# Patient Record
Sex: Female | Born: 1935 | Race: White | Smoking: Former smoker
Health system: Southern US, Community
[De-identification: ages and names within clinical notes are randomized; demographics above are authoritative.]

## PROBLEM LIST (undated history)

## (undated) DIAGNOSIS — K219 Gastro-esophageal reflux disease without esophagitis: Secondary | ICD-10-CM

## (undated) DIAGNOSIS — M549 Dorsalgia, unspecified: Secondary | ICD-10-CM

## (undated) DIAGNOSIS — E119 Type 2 diabetes mellitus without complications: Secondary | ICD-10-CM

## (undated) DIAGNOSIS — I10 Essential (primary) hypertension: Secondary | ICD-10-CM

## (undated) HISTORY — PX: OTHER SURGICAL HISTORY: SHX169

## (undated) HISTORY — PX: ABDOMINAL HYSTERECTOMY: SHX81

---

## 2015-10-04 ENCOUNTER — Encounter: Payer: Self-pay | Admitting: Emergency Medicine

## 2015-10-04 ENCOUNTER — Emergency Department: Payer: Medicare PPO

## 2015-10-04 ENCOUNTER — Emergency Department
Admission: EM | Admit: 2015-10-04 | Discharge: 2015-10-04 | Disposition: A | Discharge status: Home / Self Care | Payer: Medicare PPO | Attending: Emergency Medicine | Admitting: Emergency Medicine

## 2015-10-04 DIAGNOSIS — R1032 Left lower quadrant pain: Secondary | ICD-10-CM | POA: Diagnosis present

## 2015-10-04 DIAGNOSIS — R109 Unspecified abdominal pain: Secondary | ICD-10-CM | POA: Diagnosis not present

## 2015-10-04 DIAGNOSIS — R112 Nausea with vomiting, unspecified: Secondary | ICD-10-CM | POA: Diagnosis not present

## 2015-10-04 DIAGNOSIS — I10 Essential (primary) hypertension: Secondary | ICD-10-CM | POA: Insufficient documentation

## 2015-10-04 DIAGNOSIS — E119 Type 2 diabetes mellitus without complications: Secondary | ICD-10-CM | POA: Insufficient documentation

## 2015-10-04 HISTORY — DX: Essential (primary) hypertension: I10

## 2015-10-04 HISTORY — DX: Gastro-esophageal reflux disease without esophagitis: K21.9

## 2015-10-04 HISTORY — DX: Type 2 diabetes mellitus without complications: E11.9

## 2015-10-04 HISTORY — DX: Dorsalgia, unspecified: M54.9

## 2015-10-04 LAB — CBC WITH DIFFERENTIAL/PLATELET
BASOS PCT: 1 %
Basophils Absolute: 0.1 10*3/uL (ref 0–0.1)
Eosinophils Absolute: 0.1 10*3/uL (ref 0–0.7)
Eosinophils Relative: 1 %
HEMATOCRIT: 44.9 % (ref 35.0–47.0)
Hemoglobin: 14.3 g/dL (ref 12.0–16.0)
LYMPHS ABS: 1.4 10*3/uL (ref 1.0–3.6)
LYMPHS PCT: 10 %
MCH: 28.9 pg (ref 26.0–34.0)
MCHC: 32 g/dL (ref 32.0–36.0)
MCV: 90.3 fL (ref 80.0–100.0)
MONO ABS: 0.6 10*3/uL (ref 0.2–0.9)
MONOS PCT: 4 %
NEUTROS ABS: 12 10*3/uL — AB (ref 1.4–6.5)
Neutrophils Relative %: 84 %
Platelets: 289 10*3/uL (ref 150–440)
RBC: 4.97 MIL/uL (ref 3.80–5.20)
RDW: 13.3 % (ref 11.5–14.5)
WBC: 14.2 10*3/uL — ABNORMAL HIGH (ref 3.6–11.0)

## 2015-10-04 LAB — COMPREHENSIVE METABOLIC PANEL
ALT: 15 U/L (ref 14–54)
ANION GAP: 9 (ref 5–15)
AST: 17 U/L (ref 15–41)
Albumin: 4 g/dL (ref 3.5–5.0)
Alkaline Phosphatase: 70 U/L (ref 38–126)
BILIRUBIN TOTAL: 0.5 mg/dL (ref 0.3–1.2)
BUN: 12 mg/dL (ref 6–20)
CALCIUM: 9 mg/dL (ref 8.9–10.3)
CO2: 27 mmol/L (ref 22–32)
Chloride: 100 mmol/L — ABNORMAL LOW (ref 101–111)
Creatinine, Ser: 0.96 mg/dL (ref 0.44–1.00)
GFR, EST NON AFRICAN AMERICAN: 55 mL/min — AB (ref >60)
Glucose, Bld: 189 mg/dL — ABNORMAL HIGH (ref 65–99)
POTASSIUM: 3.4 mmol/L — AB (ref 3.5–5.1)
Sodium: 136 mmol/L (ref 135–145)
TOTAL PROTEIN: 7.7 g/dL (ref 6.5–8.1)

## 2015-10-04 LAB — URINALYSIS COMPLETE WITH MICROSCOPIC (ARMC ONLY)
Bilirubin Urine: NEGATIVE
Glucose, UA: NEGATIVE mg/dL
KETONES UR: NEGATIVE mg/dL
NITRITE: POSITIVE — AB
PH: 5 (ref 5.0–8.0)
PROTEIN: 100 mg/dL — AB
SPECIFIC GRAVITY, URINE: 1.023 (ref 1.005–1.030)

## 2015-10-04 LAB — LIPASE, BLOOD: Lipase: 27 U/L (ref 11–51)

## 2015-10-04 MED ORDER — ONDANSETRON HCL 4 MG/2ML IJ SOLN
4.0000 mg | Freq: Once | INTRAMUSCULAR | Status: AC
  Administered 2015-10-04: 4 mg via INTRAVENOUS
  Filled 2015-10-04: qty 2

## 2015-10-04 MED ORDER — CIPROFLOXACIN HCL 500 MG PO TABS: 500.0000 mg | ORAL_TABLET | Freq: Two times a day (BID) | ORAL | Status: AC

## 2015-10-04 MED ORDER — HYDROMORPHONE HCL 2 MG PO TABS: 2.0000 mg | ORAL_TABLET | Freq: Two times a day (BID) | ORAL | Status: DC | PRN

## 2015-10-04 MED ORDER — HYDROMORPHONE HCL 1 MG/ML IJ SOLN
0.5000 mg | Freq: Once | INTRAMUSCULAR | Status: AC
  Administered 2015-10-04: 0.5 mg via INTRAVENOUS
  Filled 2015-10-04: qty 1

## 2015-10-04 MED ORDER — TAMSULOSIN HCL 0.4 MG PO CAPS: 0.4000 mg | ORAL_CAPSULE | Freq: Every day | ORAL | Status: DC

## 2015-10-04 MED ORDER — HYDROMORPHONE HCL 2 MG PO TABS
2.0000 mg | ORAL_TABLET | Freq: Once | ORAL | Status: AC
  Administered 2015-10-04: 2 mg via ORAL
  Filled 2015-10-04: qty 1

## 2015-10-04 MED ORDER — KETOROLAC TROMETHAMINE 30 MG/ML IJ SOLN
30.0000 mg | Freq: Once | INTRAMUSCULAR | Status: AC
  Administered 2015-10-04: 30 mg via INTRAVENOUS
  Filled 2015-10-04: qty 1

## 2015-10-04 MED ORDER — ONDANSETRON HCL 4 MG PO TABS: 4.0000 mg | ORAL_TABLET | Freq: Three times a day (TID) | ORAL | Status: DC | PRN

## 2015-10-04 MED ORDER — SODIUM CHLORIDE 0.9 % IV BOLUS (SEPSIS)
500.0000 mL | Freq: Once | INTRAVENOUS | Status: AC
  Administered 2015-10-04: 500 mL via INTRAVENOUS

## 2015-10-04 NOTE — ED Provider Notes (Signed)
Time Seen: Approximately ----------------------------------------- 12:41 PM on 10/04/2015 -----------------------------------------   I have reviewed the triage notes  Chief Complaint: Abdominal Pain   History of Present Illness: Karen Page is a 80 y.o. female who presents with acute onset of left-sided flank and left lower quadrant abdominal pain that started suddenly today at 8 AM. Patient states that it makes her want to move around but no increased pain with movement. She denies any dysuria, hematuria, urinary frequency. She denies any right-sided abdominal pain. She has had persistent nausea and has vomited multiple times with no blood or bile. Patient denies any loose stool or diarrhea. She denies any fever at home. No radiation of pain into the leg or lower back in the middle.   Past Medical History  Diagnosis Date  . Hypertension   . Back pain   . GERD (gastroesophageal reflux disease)   . Diabetes mellitus without complication (HCC)     There are no active problems to display for this patient.   History reviewed. No pertinent past surgical history.  History reviewed. No pertinent past surgical history.  No current outpatient prescriptions on file.  Allergies:  Codeine and Tramadol  Family History: History reviewed. No pertinent family history.  Social History: Social History  Substance Use Topics  . Smoking status: Never Smoker   . Smokeless tobacco: None  . Alcohol Use: No     Review of Systems:   10 point review of systems was performed and was otherwise negative:  Constitutional: No fever Eyes: No visual disturbances ENT: No sore throat, ear pain Cardiac: No chest pain Respiratory: No shortness of breath, wheezing, or stridor Abdomen: Exclusive of left-sided left flank and left lower abdominal area Endocrine: No weight loss, No night sweats Extremities: No peripheral edema, cyanosis Skin: No rashes, easy bruising Neurologic: No focal  weakness, trouble with speech or swollowing Urologic: No dysuria, Hematuria, or urinary frequency  Physical Exam:  ED Triage Vitals  Enc Vitals Group     BP 10/04/15 1216 140/99 mmHg     Pulse Rate 10/04/15 1216 85     Resp 10/04/15 1216 18     Temp --      Temp src --      SpO2 10/04/15 1216 100 %     Weight 10/04/15 1216 176 lb (79.833 kg)     Height 10/04/15 1216 5\' 3"  (1.6 m)     Head Cir --      Peak Flow --      Pain Score 10/04/15 1216 10     Pain Loc --      Pain Edu? --      Excl. in GC? --     General: Awake , Alert , and Oriented times 3; GCS 15 patient appears uncomfortable and has difficulty sitting still on the stretcher Head: Normal cephalic , atraumatic Eyes: Pupils equal , round, reactive to light Nose/Throat: No nasal drainage, patent upper airway without erythema or exudate.  Neck: Supple, Full range of motion, No anterior adenopathy or palpable thyroid masses Lungs: Clear to ascultation without wheezes , rhonchi, or rales Heart: Regular rate, regular rhythm without murmurs , gallops , or rubs Abdomen: Soft, mild tenderness to deep palpation in the left anterior abdominal area without rebound, guarding , or rigidity; bowel sounds positive and symmetric in all 4 quadrants. No organomegaly .   No abdominal masses     Extremities: 2 plus symmetric pulses. No edema, clubbing or cyanosis Neurologic: normal ambulation, Motor  symmetric without deficits, sensory intact Skin: warm, dry, no rashes   Labs:   All laboratory work was reviewed including any pertinent negatives or positives listed below:  Labs Reviewed  CBC WITH DIFFERENTIAL/PLATELET  COMPREHENSIVE METABOLIC PANEL  LIPASE, BLOOD  URINALYSIS COMPLETEWITH MICROSCOPIC ([facility] ONLY)    EKG: ED ECG REPORT I, Jennye Moccasin, the attending physician, personally viewed and interpreted this ECG.  Date: 10/04/2015 EKG Time: 1223 Rate: 80 Rhythm: normal sinus rhythm QRS Axis: normal Intervals:  normal ST/T Wave abnormalities: normal Conduction Disutrbances: none Narrative Interpretation: unremarkable   Radiology:   EXAM: CT ABDOMEN AND PELVIS WITHOUT CONTRAST  TECHNIQUE: Multidetector CT imaging of the abdomen and pelvis was performed following the standard protocol without IV contrast.  COMPARISON: None.  FINDINGS: Lower Chest: The lung bases are clear. Visualized cardiac structures are within normal limits for size. No pericardial effusion. Unremarkable visualized distal thoracic esophagus.  Abdomen: Unenhanced CT was performed per clinician order. Lack of IV contrast limits sensitivity and specificity, especially for evaluation of abdominal/pelvic solid viscera. Within these limitations, unremarkable CT appearance of the stomach, duodenum, spleen, adrenal glands and pancreas. Normal hepatic contour and morphology. Diffuse low attenuation of the hepatic parenchyma consistent with hepatic steatosis. There is trace sparing around the gallbladder fossa. Gallbladder is unremarkable. No intra or extrahepatic biliary ductal dilatation.  Unremarkable appearance of the right kidney. No hydronephrosis or nephrolithiasis. Mild left-sided hydronephrosis secondary to a 2-3 mm stone in the distal ureter. Mild left perinephric stranding. No additional nephrolithiasis identified.  Colonic diverticular disease without CT evidence of active inflammation. The appendix is surgically absent. No focal bowel wall thickening or evidence obstruction. No free fluid or suspicious adenopathy.  Pelvis: Surgical changes of prior hysterectomy. There is a moderate pelvic floor laxity. Unremarkable bladder. No free fluid or suspicious adenopathy.  Bones/Soft Tissues: No acute fracture or aggressive appearing lytic or blastic osseous lesion.  Vascular: Limited evaluation in the absence of intravenous contrast. Mild atherosclerotic vascular calcifications without evidence  of aneurysm.  IMPRESSION: 1. Obstructing 3 mm stone in the distal left ureter resulting in mild hydronephrosis and perinephric edema. 2. No additional nephrolithiasis identified. 3. Surgical changes of prior partial hysterectomy and appendectomy. 4. Sigmoid diverticulosis without evidence of active inflammation. 5. Scattered atherosclerotic vascular calcifications.   I personally reviewed the radiologic studies     ED Course: Differential diagnosis includes but is not exclusive to ovarian cyst, ovarian torsion, acute appendicitis, urinary tract infection, endometriosis, bowel obstruction, colitis, renal colic, gastroenteritis, etc. Given the patient's current clinical presentation and objective findings appears that she has renal colic. Patient more importantly doesn't have any signs of acute abdominal aneurysmal disease or diverticulitis. Patient had good pain control with IV medications here in emergency department discharged on Cipro for what appears to be an associated urinary tract infection. I felt patient unlikely to have pyelonephritis at this time. Patient's case was reviewed with the on-call urologist and agree with outpatient management and the patient be established on Cipro as an outpatient and also has a urine culture pending. She'll be prescribed Flomax along with Dilaudid and Zofran.*    Assessment: * Acute renal colic   Final Clinical Impression:   Final diagnoses:  Left flank pain     Plan:  Outpatient management Patient was advised to return immediately if condition worsens. Patient was advised to follow up with her primary care physician or other specialized physicians involved and in their current assessment.  Jennye Moccasin, MD 10/04/15 [phone]

## 2015-10-04 NOTE — ED Notes (Signed)
Pt reports left lower back pain that wraps around to her llq started 0800 and has gotten progressively worse today with vomiting.

## 2015-10-04 NOTE — ED Notes (Signed)
Pt to ed with c/o vomiting x 3 today, also with noted left upper abd pain that radiates to left flank area.  Pt denies difficulty with urination.  Pt with normal bm yesterday.

## 2015-10-07 LAB — URINE CULTURE

## 2015-10-13 ENCOUNTER — Other Ambulatory Visit
Admission: RE | Admit: 2015-10-13 | Discharge: 2015-10-13 | Disposition: A | Discharge status: Home / Self Care | Payer: Medicare PPO | Source: Skilled Nursing Facility | Attending: Urology | Admitting: Urology

## 2015-10-13 DIAGNOSIS — N202 Calculus of kidney with calculus of ureter: Secondary | ICD-10-CM | POA: Insufficient documentation

## 2015-10-13 LAB — BASIC METABOLIC PANEL
ANION GAP: 6 (ref 5–15)
BUN: 12 mg/dL (ref 6–20)
CO2: 28 mmol/L (ref 22–32)
Calcium: 8.9 mg/dL (ref 8.9–10.3)
Chloride: 106 mmol/L (ref 101–111)
Creatinine, Ser: 0.67 mg/dL (ref 0.44–1.00)
GFR calc Af Amer: >60 mL/min (ref >60)
GLUCOSE: 151 mg/dL — AB (ref 65–99)
POTASSIUM: 3 mmol/L — AB (ref 3.5–5.1)
Sodium: 140 mmol/L (ref 135–145)

## 2015-10-13 LAB — CBC WITH DIFFERENTIAL/PLATELET
BASOS ABS: 0 10*3/uL (ref 0–0.1)
Basophils Relative: 1 %
EOS ABS: 0.3 10*3/uL (ref 0–0.7)
EOS PCT: 3 %
HCT: 40.1 % (ref 35.0–47.0)
Hemoglobin: 13 g/dL (ref 12.0–16.0)
LYMPHS PCT: 23 %
Lymphs Abs: 2 10*3/uL (ref 1.0–3.6)
MCH: 28.9 pg (ref 26.0–34.0)
MCHC: 32.3 g/dL (ref 32.0–36.0)
MCV: 89.4 fL (ref 80.0–100.0)
Monocytes Absolute: 0.5 10*3/uL (ref 0.2–0.9)
Monocytes Relative: 6 %
Neutro Abs: 5.7 10*3/uL (ref 1.4–6.5)
Neutrophils Relative %: 67 %
PLATELETS: 402 10*3/uL (ref 150–440)
RBC: 4.49 MIL/uL (ref 3.80–5.20)
RDW: 13.1 % (ref 11.5–14.5)
WBC: 8.6 10*3/uL (ref 3.6–11.0)

## 2015-10-13 LAB — HEPATIC FUNCTION PANEL
ALBUMIN: 3.2 g/dL — AB (ref 3.5–5.0)
ALT: 13 U/L — AB (ref 14–54)
AST: 17 U/L (ref 15–41)
Alkaline Phosphatase: 62 U/L (ref 38–126)
Bilirubin, Direct: <0.1 mg/dL — ABNORMAL LOW (ref 0.1–0.5)
TOTAL PROTEIN: 6.9 g/dL (ref 6.5–8.1)
Total Bilirubin: 0.4 mg/dL (ref 0.3–1.2)

## 2015-11-03 ENCOUNTER — Other Ambulatory Visit
Admission: RE | Admit: 2015-11-03 | Discharge: 2015-11-03 | Disposition: A | Discharge status: Home / Self Care | Payer: Medicare PPO | Source: Ambulatory Visit | Attending: Infectious Diseases | Admitting: Infectious Diseases

## 2015-11-03 DIAGNOSIS — A4151 Sepsis due to Escherichia coli [E. coli]: Secondary | ICD-10-CM | POA: Insufficient documentation

## 2015-11-03 LAB — CBC WITH DIFFERENTIAL/PLATELET
Basophils Absolute: 0.1 10*3/uL (ref 0–0.1)
Basophils Relative: 1 %
EOS ABS: 0.4 10*3/uL (ref 0–0.7)
EOS PCT: 7 %
HCT: 40.6 % (ref 35.0–47.0)
Hemoglobin: 13.4 g/dL (ref 12.0–16.0)
LYMPHS ABS: 1.5 10*3/uL (ref 1.0–3.6)
Lymphocytes Relative: 25 %
MCH: 29.8 pg (ref 26.0–34.0)
MCHC: 33 g/dL (ref 32.0–36.0)
MCV: 90.3 fL (ref 80.0–100.0)
MONOS PCT: 8 %
Monocytes Absolute: 0.5 10*3/uL (ref 0.2–0.9)
Neutro Abs: 3.5 10*3/uL (ref 1.4–6.5)
Neutrophils Relative %: 59 %
PLATELETS: 217 10*3/uL (ref 150–440)
RBC: 4.5 MIL/uL (ref 3.80–5.20)
RDW: 13 % (ref 11.5–14.5)
WBC: 5.9 10*3/uL (ref 3.6–11.0)

## 2015-11-03 LAB — CREATININE, SERUM
Creatinine, Ser: 0.9 mg/dL (ref 0.44–1.00)
GFR calc Af Amer: >60 mL/min (ref >60)
GFR calc non Af Amer: 60 mL/min — ABNORMAL LOW (ref >60)

## 2015-11-03 LAB — ALT: ALT: 17 U/L (ref 14–54)

## 2015-11-03 LAB — AST: AST: 19 U/L (ref 15–41)

## 2015-11-03 LAB — BUN: BUN: 13 mg/dL (ref 6–20)

## 2018-12-12 ENCOUNTER — Other Ambulatory Visit: Payer: Self-pay

## 2018-12-12 ENCOUNTER — Encounter: Payer: Self-pay | Admitting: Intensive Care

## 2018-12-12 ENCOUNTER — Emergency Department
Admission: EM | Admit: 2018-12-12 | Discharge: 2018-12-12 | Disposition: A | Discharge status: Home / Self Care | Payer: Medicare HMO | Attending: Emergency Medicine | Admitting: Emergency Medicine

## 2018-12-12 ENCOUNTER — Emergency Department: Payer: Medicare HMO

## 2018-12-12 DIAGNOSIS — E119 Type 2 diabetes mellitus without complications: Secondary | ICD-10-CM | POA: Diagnosis not present

## 2018-12-12 DIAGNOSIS — I10 Essential (primary) hypertension: Secondary | ICD-10-CM | POA: Diagnosis not present

## 2018-12-12 DIAGNOSIS — Z79899 Other long term (current) drug therapy: Secondary | ICD-10-CM | POA: Insufficient documentation

## 2018-12-12 DIAGNOSIS — R531 Weakness: Secondary | ICD-10-CM | POA: Diagnosis not present

## 2018-12-12 DIAGNOSIS — E860 Dehydration: Secondary | ICD-10-CM

## 2018-12-12 LAB — CBC
HCT: 47.6 % — ABNORMAL HIGH (ref 36.0–46.0)
Hemoglobin: 15.1 g/dL — ABNORMAL HIGH (ref 12.0–15.0)
MCH: 28.5 pg (ref 26.0–34.0)
MCHC: 31.7 g/dL (ref 30.0–36.0)
MCV: 89.8 fL (ref 80.0–100.0)
Platelets: 249 10*3/uL (ref 150–400)
RBC: 5.3 MIL/uL — ABNORMAL HIGH (ref 3.87–5.11)
RDW: 12.7 % (ref 11.5–15.5)
WBC: 11.5 10*3/uL — ABNORMAL HIGH (ref 4.0–10.5)
nRBC: 0 % (ref 0.0–0.2)

## 2018-12-12 LAB — BASIC METABOLIC PANEL
Anion gap: 12 (ref 5–15)
BUN: 12 mg/dL (ref 8–23)
CO2: 25 mmol/L (ref 22–32)
Calcium: 9.8 mg/dL (ref 8.9–10.3)
Chloride: 99 mmol/L (ref 98–111)
Creatinine, Ser: 1.14 mg/dL — ABNORMAL HIGH (ref 0.44–1.00)
GFR calc Af Amer: 52 mL/min — ABNORMAL LOW (ref >60)
GFR calc non Af Amer: 45 mL/min — ABNORMAL LOW (ref >60)
Glucose, Bld: 191 mg/dL — ABNORMAL HIGH (ref 70–99)
Potassium: 3.5 mmol/L (ref 3.5–5.1)
Sodium: 136 mmol/L (ref 135–145)

## 2018-12-12 LAB — URINALYSIS, COMPLETE (UACMP) WITH MICROSCOPIC
Bacteria, UA: NONE SEEN
Bilirubin Urine: NEGATIVE
Glucose, UA: 150 mg/dL — AB
Hgb urine dipstick: NEGATIVE
Ketones, ur: NEGATIVE mg/dL
Leukocytes, UA: NEGATIVE
Nitrite: NEGATIVE
PROTEIN: 100 mg/dL — AB
Specific Gravity, Urine: 1.018 (ref 1.005–1.030)
pH: 7 (ref 5.0–8.0)

## 2018-12-12 LAB — TROPONIN I: Troponin I: <0.03 ng/mL (ref <0.03)

## 2018-12-12 MED ORDER — SODIUM CHLORIDE 0.9 % IV BOLUS
1000.0000 mL | Freq: Once | INTRAVENOUS | Status: AC
  Administered 2018-12-12: 1000 mL via INTRAVENOUS

## 2018-12-12 MED ORDER — ACETAMINOPHEN 500 MG PO TABS
1000.0000 mg | ORAL_TABLET | Freq: Once | ORAL | Status: AC
  Administered 2018-12-12: 1000 mg via ORAL
  Filled 2018-12-12: qty 2

## 2018-12-12 NOTE — ED Triage Notes (Addendum)
Patient arrived from home by EMS for weakness in lower extremities and SOB with headache and dizziness. Patient has had cold symptoms for a few days and fever that started yesterday. Patient states "after using the restroom I went to stand and my legs were so weak I had to scoot myself down to the floor. Then it took me a hour and a half to get to my bedroom to call 911" A&O x4 upon arrival to ED EMs vitals 105P, 188 blood sugar, 90RA - patient placed on 2L with sats of 96%, blood pressure 122/80

## 2018-12-12 NOTE — ED Provider Notes (Signed)
[facility] Emergency Department Provider Note   ____________________________________________   I have reviewed the triage vital signs and the nursing notes.   HISTORY  Chief Complaint Weakness and Shortness of Breath   History limited by: Not Limited   HPI Karen Page is a 84 y.o. female who presents to the emergency department today with concern for weakness.  She states that today she was having a hard time getting up off the ground.  She states she felt weak in both legs and both arms.  Weakness seem to come on somewhat suddenly.  She states for the past couple days however she feels like she is coming down with cold.  She has had some cough and congestion.  She denies any associated chest pain with this.  She does feel like she had a fever yesterday.  Per medical record review patient has a history of GERD, hypertension, diabetes  Past Medical History:  Diagnosis Date  . Back pain   . Diabetes mellitus without complication (HCC)   . GERD (gastroesophageal reflux disease)   . Hypertension     There are no active problems to display for this patient.   No past surgical history on file.  Prior to Admission medications   Medication Sig Start Date End Date Taking? Authorizing Provider  HYDROmorphone (DILAUDID) 2 MG tablet Take 1 tablet (2 mg total) by mouth every 12 (twelve) hours as needed for severe pain. 10/04/15   Jennye Moccasin, MD  ondansetron (ZOFRAN) 4 MG tablet Take 1 tablet (4 mg total) by mouth every 8 (eight) hours as needed for nausea or vomiting. 10/04/15   Jennye Moccasin, MD  tamsulosin (FLOMAX) 0.4 MG CAPS capsule Take 1 capsule (0.4 mg total) by mouth daily. 10/04/15   Jennye Moccasin, MD    Allergies Codeine and Tramadol  No family history on file.  Social History Social History   Tobacco Use  . Smoking status: Never Smoker  Substance Use Topics  . Alcohol use: No  . Drug use: No    Review of  Systems Constitutional: Positive for fever Eyes: No visual changes. ENT: No sore throat. Cardiovascular: Denies chest pain. Respiratory: Positive for cough Gastrointestinal: No abdominal pain.  Positive for nausea, decreased apetite.  Genitourinary: Negative for dysuria. Musculoskeletal: Negative for back pain. Skin: Negative for rash. Neurological: Negative for headaches, focal weakness or numbness.  ____________________________________________   PHYSICAL EXAM:  VITAL SIGNS: ED Triage Vitals [12/12/18 1728]  Enc Vitals Group     BP 117/85     Pulse Rate 96     Resp 20     Temp 98.1 F (36.7 C)     Temp Source Oral     SpO2 90 %    Constitutional: Alert and oriented.  Eyes: Conjunctivae are normal.  ENT      Head: Normocephalic and atraumatic.      Nose: No congestion/rhinnorhea.      Mouth/Throat: Mucous membranes are moist.      Neck: No stridor. Hematological/Lymphatic/Immunilogical: No cervical lymphadenopathy. Cardiovascular: Normal rate, regular rhythm.  No murmurs, rubs, or gallops. Respiratory: Normal respiratory effort without tachypnea nor retractions. Breath sounds are clear and equal bilaterally. No wheezes/rales/rhonchi. Gastrointestinal: Soft and non tender. No rebound. No guarding.  Genitourinary: Deferred Musculoskeletal: Normal range of motion in all extremities. No lower extremity edema. Neurologic:  Normal speech and language. No gross focal neurologic deficits are appreciated.  Skin:  Skin is warm, dry and intact. No rash noted.  Psychiatric: Mood and affect are normal. Speech and behavior are normal. Patient exhibits appropriate insight and judgment.  ____________________________________________    LABS (pertinent positives/negatives)  CBC wbc 11.5, hgb 15.1, plt 249 Trop <0.03 BMP glu 191, cr 1.14 UA clear, glu 150, protein 100 otherwise unremarkable ____________________________________________   EKG  I, Phineas SemenGraydon Andru Genter, attending  physician, personally viewed and interpreted this EKG  EKG Time: 1723 Rate: 98 Rhythm: sinus rhythm Axis: left axis deviation Intervals: qtc 495 QRS: narrow, q waves aVR, V1 ST changes: no st elevation Impression: abnormal ekg  ____________________________________________    RADIOLOGY  CXR No acute disease  CT head No acute abnormality  ____________________________________________   PROCEDURES  Procedures  ____________________________________________   INITIAL IMPRESSION / ASSESSMENT AND PLAN / ED COURSE  Pertinent labs & imaging results that were available during my care of the patient were reviewed by me and considered in my medical decision making (see chart for details).   Patient presented to the emergency department today because of concerns for weakness.  Differential would be broad including ACS, infection, dehydration, arrhythmia amongst other etiologies.  Patient's blood work does show a mildly elevated creatinine.  Patient was given IV fluids here.  She did feel better afterwards was able to ambulate without difficulty.  Rest of the work-up without any concerning findings.  CT head and chest x-ray without any acute findings.  UA without signs of infection.  Mild leukocytosis but at this point no obvious infection.  ____________________________________________   FINAL CLINICAL IMPRESSION(S) / ED DIAGNOSES  Final diagnoses:  Weakness  Dehydration     Note: This dictation was prepared with Dragon dictation. Any transcriptional errors that result from this process are unintentional     Phineas SemenGoodman, Karen Estabrook, MD 12/12/18 2026

## 2018-12-12 NOTE — ED Notes (Signed)
[facility] care assumed. Pt resting comfortably in bed, no co pain at this time. IVF infusing well, waiting on completion of fluids to be discharged.

## 2018-12-12 NOTE — Discharge Instructions (Addendum)
Please seek medical attention for any high fevers, chest pain, shortness of breath, change in behavior, persistent vomiting, bloody stool or any other new or concerning symptoms.  

## 2018-12-12 NOTE — ED Notes (Signed)
Patient discharged to home per MD order. Patient in stable condition, and deemed medically cleared by ED provider for discharge. Discharge instructions reviewed with patient/family using "Teach Back"; verbalized understanding of medication education and administration, and information about follow-up care. Denies further concerns. ° °

## 2018-12-12 NOTE — ED Notes (Signed)
Pt walking around room without difficulty.  

## 2020-05-24 ENCOUNTER — Ambulatory Visit: Payer: Medicare HMO | Attending: Internal Medicine

## 2020-05-24 DIAGNOSIS — Z23 Encounter for immunization: Secondary | ICD-10-CM

## 2020-05-24 NOTE — Progress Notes (Signed)
   Covid-19 Vaccination Clinic  Name:  Rosmery Duggin    MRN: [id number] DOB: 01-23-1935  05/24/2020  Ms. Kendrix was observed post Covid-19 immunization for 15 minutes without incident. She was provided with Vaccine Information Sheet and instruction to access the V-Safe system.   Ms. Rae was instructed to call 911 with any severe reactions post vaccine: Marland Kitchen Difficulty breathing  . Swelling of face and throat  . A fast heartbeat  . A bad rash all over body  . Dizziness and weakness   Immunizations Administered    Name Date Dose VIS Date Route   Pfizer COVID-19 Vaccine 05/24/2020  8:14 AM 0.3 mL 01/23/2019 Intramuscular   Manufacturer: [employer], [employer]   Lot: [id number]   NDC: [address]-3

## 2020-06-14 ENCOUNTER — Ambulatory Visit: Payer: Medicare HMO | Attending: Internal Medicine

## 2020-06-14 DIAGNOSIS — Z23 Encounter for immunization: Secondary | ICD-10-CM

## 2020-06-14 NOTE — Progress Notes (Signed)
   Covid-19 Vaccination Clinic  Name:  Darcey Cardy    MRN: [id number] DOB: [birth year]  06/14/2020  Ms. Landing was observed post Covid-19 immunization for 15 minutes without incident. She was provided with Vaccine Information Sheet and instruction to access the V-Safe system.   Ms. Banner was instructed to call 911 with any severe reactions post vaccine: Marland Kitchen Difficulty breathing  . Swelling of face and throat  . A fast heartbeat  . A bad rash all over body  . Dizziness and weakness   Immunizations Administered    Name Date Dose VIS Date Route   Pfizer COVID-19 Vaccine 06/14/2020  8:04 AM 0.3 mL 01/23/2019 Intramuscular   Manufacturer: [employer], [employer]   Lot: [id number]   NDC: [address]-7

## 2020-07-03 ENCOUNTER — Ambulatory Visit (INDEPENDENT_AMBULATORY_CARE_PROVIDER_SITE_OTHER): Payer: Medicare HMO

## 2020-07-03 ENCOUNTER — Other Ambulatory Visit: Payer: Self-pay

## 2020-07-03 ENCOUNTER — Ambulatory Visit
Admission: EM | Admit: 2020-07-03 | Discharge: 2020-07-03 | Disposition: A | Discharge status: Home / Self Care | Payer: Medicare HMO | Attending: Family Medicine | Admitting: Family Medicine

## 2020-07-03 DIAGNOSIS — M25512 Pain in left shoulder: Secondary | ICD-10-CM

## 2020-07-03 DIAGNOSIS — R52 Pain, unspecified: Secondary | ICD-10-CM

## 2020-07-03 MED ORDER — HYDROCODONE-ACETAMINOPHEN 5-325 MG PO TABS: 1.0000 | ORAL_TABLET | Freq: Three times a day (TID) | ORAL | 0 refills | Status: DC | PRN

## 2020-07-03 NOTE — ED Provider Notes (Signed)
[facility]    CSN: [id number] Arrival date & time: 07/03/20  1004      History   Chief Complaint Chief Complaint  Patient presents with  . Arm Injury   HPI   85 year old female presents with the above complaint.  Patient reports left shoulder pain for the past 2 days.  States that she awoke with the pain.  She localizes the pain to the lateral shoulder/upper arm.  Has difficulty with range of motion.  Can barely lift her arm.  Denies any fall, trauma, injury.  Pain 7/10 in severity.  No relieving factors.  No other associated symptoms.  No other complaints.  Past Medical History:  Diagnosis Date  . Back pain   . Diabetes mellitus without complication (HCC)   . GERD (gastroesophageal reflux disease)   . Hypertension    Past Surgical History:  Procedure Laterality Date  . ABDOMINAL HYSTERECTOMY    . renal stents      OB History   No obstetric history on file.      Home Medications    Prior to Admission medications   Medication Sig Start Date End Date Taking? Authorizing Provider  Cholecalciferol 50 MCG (2000 UT) CAPS Take 1 tablet by mouth daily. 04/01/20  Yes [provider]  empagliflozin (JARDIANCE) 25 MG TABS tablet Take by mouth. 01/24/20  Yes [provider]  atorvastatin (LIPITOR) 40 MG tablet Take 40 mg by mouth daily. 07/05/18 07/05/19  [provider]  FLUoxetine (PROZAC) 20 MG capsule Take 20 mg by mouth daily.    [provider]  gabapentin (NEURONTIN) 300 MG capsule Take 300 mg by mouth 2 (two) times daily. 08/22/18 01/24/19  [provider]  glipiZIDE (GLUCOTROL) 10 MG tablet Take 10 mg by mouth 2 (two) times daily. 07/08/17   [provider]  HYDROcodone-acetaminophen (NORCO/VICODIN) 5-325 MG tablet Take 1 tablet by mouth every 8 (eight) hours as needed for moderate pain. 07/03/20   Tommie Sams, DO  metFORMIN (GLUCOPHAGE-XR) 500 MG 24 hr tablet Take 1,000 mg by mouth 2 (two) times daily. 06/19/18    [provider]  methimazole (TAPAZOLE) 5 MG tablet Take 5 mg by mouth daily. 07/05/18 07/05/19  [provider]  metoprolol tartrate (LOPRESSOR) 25 MG tablet Take 12.5 mg by mouth 2 (two) times daily. 09/15/18 09/15/19  [provider]  ondansetron (ZOFRAN) 4 MG tablet Take by mouth.    [provider]  pantoprazole (PROTONIX) 40 MG tablet Take 40 mg by mouth daily. 09/15/18   [provider]    Family History History reviewed. No pertinent family history.  Social History Social History   Tobacco Use  . Smoking status: Former Smoker    Quit date: 2013    Years since quitting: 8.5  . Smokeless tobacco: Never Used  Substance Use Topics  . Alcohol use: No  . Drug use: No     Allergies   Aspirin, Codeine, Tramadol, Ketorolac, and Penicillins   Review of Systems Review of Systems  Musculoskeletal:       Left shoulder pain   Physical Exam Triage Vital Signs ED Triage Vitals  Enc Vitals Group     BP 07/03/20 1020 115/75     Pulse Rate 07/03/20 1020 72     Resp 07/03/20 1020 16     Temp 07/03/20 1020 98.2 F (36.8 C)     Temp Source 07/03/20 1020 Oral     SpO2 07/03/20 1020 95 %  Weight 07/03/20 1023 166 lb (75.3 kg)     Height 07/03/20 1023 5\' 3"  (1.6 m)     Head Circumference --      Peak Flow --      Pain Score 07/03/20 1020 7     Pain Loc --      Pain Edu? --      Excl. in GC? --    Updated Vital Signs BP 115/75 (BP Location: Right Arm)   Pulse 72   Temp 98.2 F (36.8 C) (Oral)   Resp 16   Ht 5\' 3"  (1.6 m)   Wt 75.3 kg   SpO2 95%   BMI 29.41 kg/m   Visual Acuity Right Eye Distance:   Left Eye Distance:   Bilateral Distance:    Right Eye Near:   Left Eye Near:    Bilateral Near:     Physical Exam Vitals and nursing note reviewed.  Constitutional:      General: She is not in acute distress.    Appearance: Normal appearance. She is not ill-appearing.  HENT:     Head: Normocephalic and atraumatic.    Eyes:     General:        Right eye: No discharge.        Left eye: No discharge.     Conjunctiva/sclera: Conjunctivae normal.  Cardiovascular:     Rate and Rhythm: Normal rate and regular rhythm.  Pulmonary:     Effort: Pulmonary effort is normal.     Breath sounds: Normal breath sounds.  Musculoskeletal:     Comments: Left shoulder -markedly decreased range of motion in all planes.  Positive empty can.  Unable to assess rotator cuff strength due to limited range of motion.  Tenderness over the bicipital groove.  Neurological:     Mental Status: She is alert.  Psychiatric:        Mood and Affect: Mood normal.        Behavior: Behavior normal.    UC Treatments / Results  Labs (all labs ordered are listed, but only abnormal results are displayed) Labs Reviewed - No data to display  EKG   Radiology DG Shoulder Left  Result Date: 07/03/2020 CLINICAL DATA:  Awoke 2 nights ago with LEFT shoulder and arm pain, difficulty lifting arm; entered history says RIGHT arm pain but patient confirms it is LEFT arm pain and a LEFT shoulder exam was ordered. EXAM: LEFT SHOULDER - 2+ VIEW COMPARISON:  None FINDINGS: Osseous demineralization. AC joint alignment normally aligned with mild degenerative changes. Glenohumeral joint space narrowing, mild. No acute fracture, dislocation, or bone destruction. Visualized ribs intact. IMPRESSION: Osseous demineralization with degenerative changes of the LEFT AC joint and LEFT glenohumeral joint. No acute abnormalities. Electronically Signed   By: M.D.   On: 07/03/2020 10:58    Procedures Procedures (including critical care time)  Medications Ordered in UC Medications - No data to display  Initial Impression / Assessment and Plan / UC Course  I have reviewed the triage vital signs and the nursing notes.  Pertinent labs & imaging results that were available during my care of the patient were reviewed by me and considered in my medical  decision making (see chart for details).    85 year old female presents with left shoulder pain.  No recent fall, trauma, injury.  X-ray obtained and independently reviewed by me.  Degenerative changes of the left AC joint as well as the left glenohumeral joint.  Otherwise unremarkable.  Vicodin as needed for pain.  Advised to see orthopedics.  Final Clinical Impressions(s) / UC Diagnoses   Final diagnoses:  Acute pain of left shoulder     Discharge Instructions     Medication as directed.  Please call [facility] clinic Orthopedics [phone]) OR [facility] [phone]) for an appt.  Sling for comfort but be sure to do some range of motion.  Take care  Dr. Adriana Simas    ED Prescriptions    Medication Sig Dispense Auth. Provider   HYDROcodone-acetaminophen (NORCO/VICODIN) 5-325 MG tablet Take 1 tablet by mouth every 8 (eight) hours as needed for moderate pain. 15 tablet Everlene Other G, DO     I have reviewed the PDMP during this encounter.   Tommie Sams, [place] 07/03/20 1141

## 2020-07-03 NOTE — ED Triage Notes (Signed)
Pt states 2 nights ago she awoke with left shoulder and upper arm pain. States unable to lift her left are at the shoulder and difficulty straightening. Denies injury but does state it started out with a "crick in my neck on the left side" before the arm pain began.

## 2020-07-03 NOTE — Discharge Instructions (Signed)
Medication as directed.  Please call [facility] clinic Orthopedics [phone]) OR [facility] [phone]) for an appt.  Sling for comfort but be sure to do some range of motion.  Take care  Dr. Adriana Simas

## 2020-09-19 IMAGING — CR DG SHOULDER 2+V*L*
3 series · 3 of 3 positions shown · non-contrast
Comparison: None

CLINICAL DATA: Awoke 2 nights ago with LEFT shoulder and arm pain,
difficulty lifting arm; entered history says RIGHT arm pain but
patient confirms it is LEFT arm pain and a LEFT shoulder exam was
ordered.

EXAM:
LEFT SHOULDER - 2+ VIEW

[shoulder grashey]
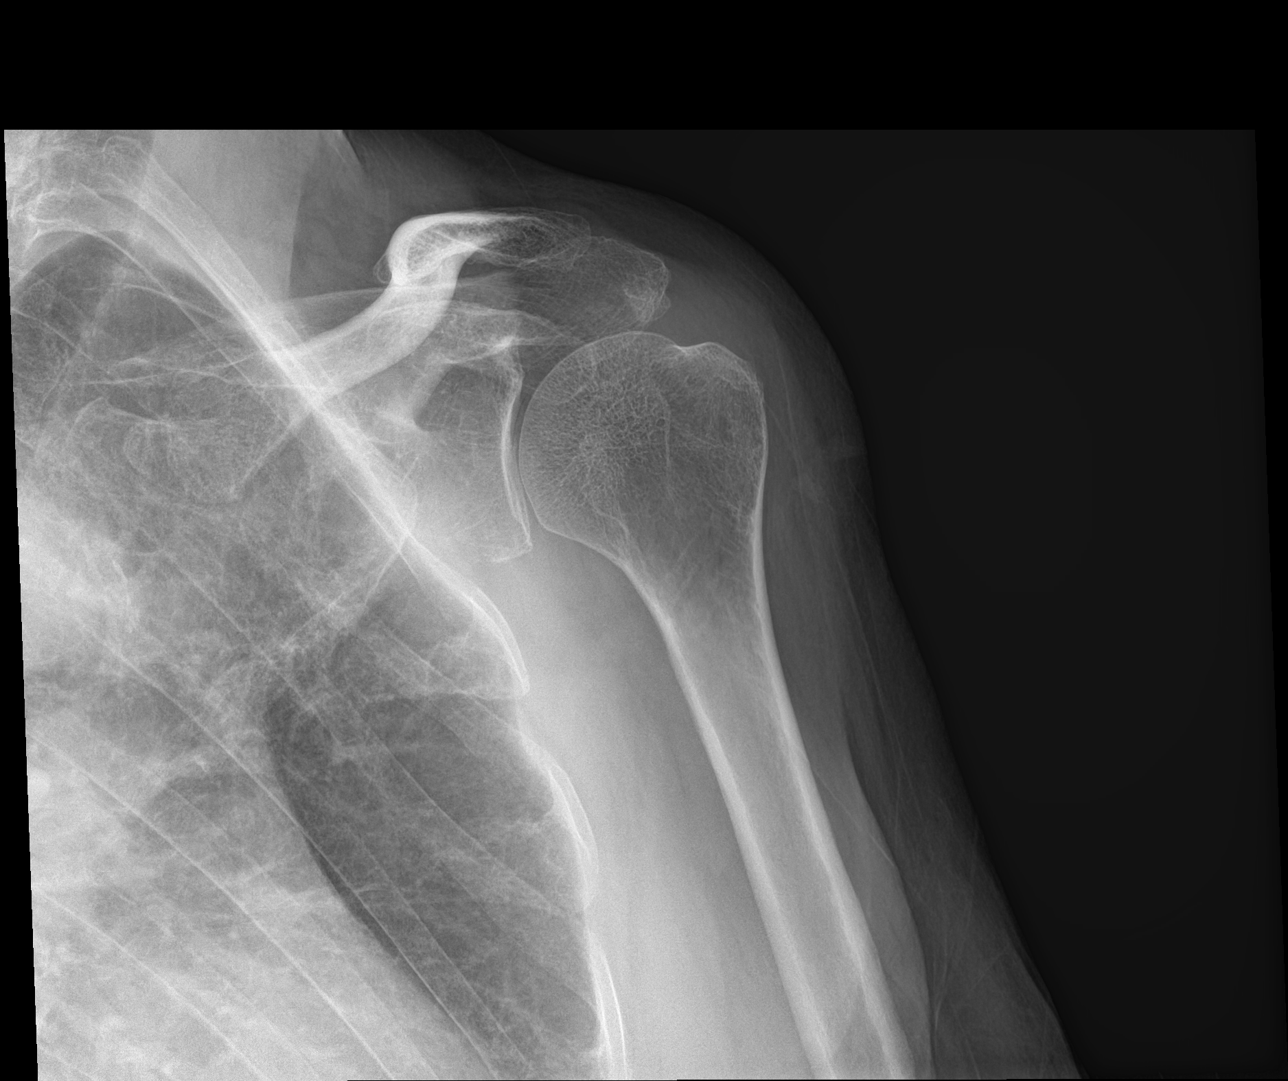

[shoulder y view]
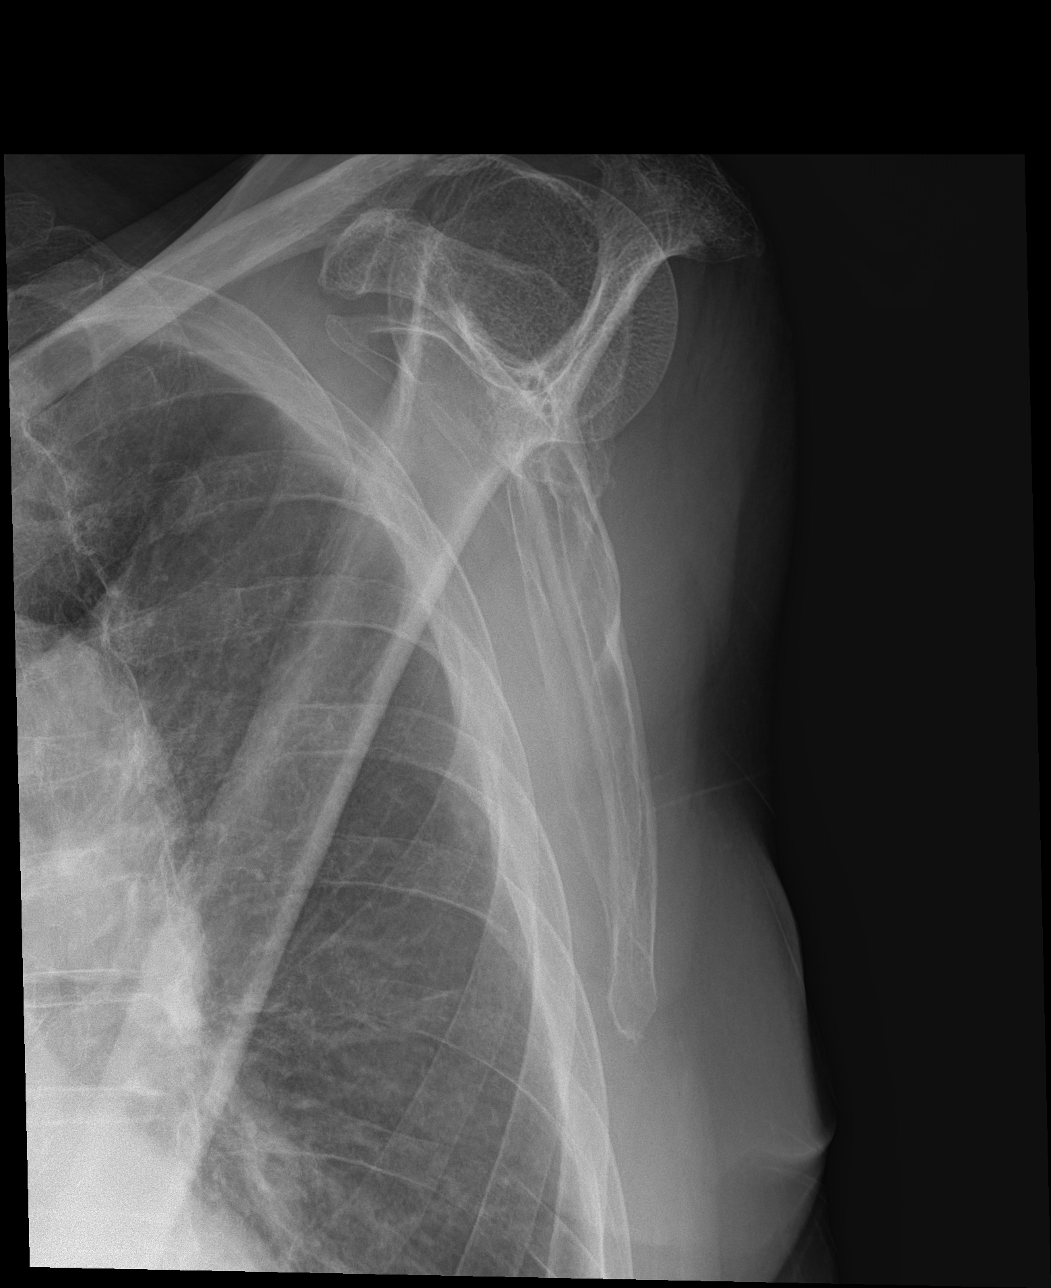

[shoulder axial]
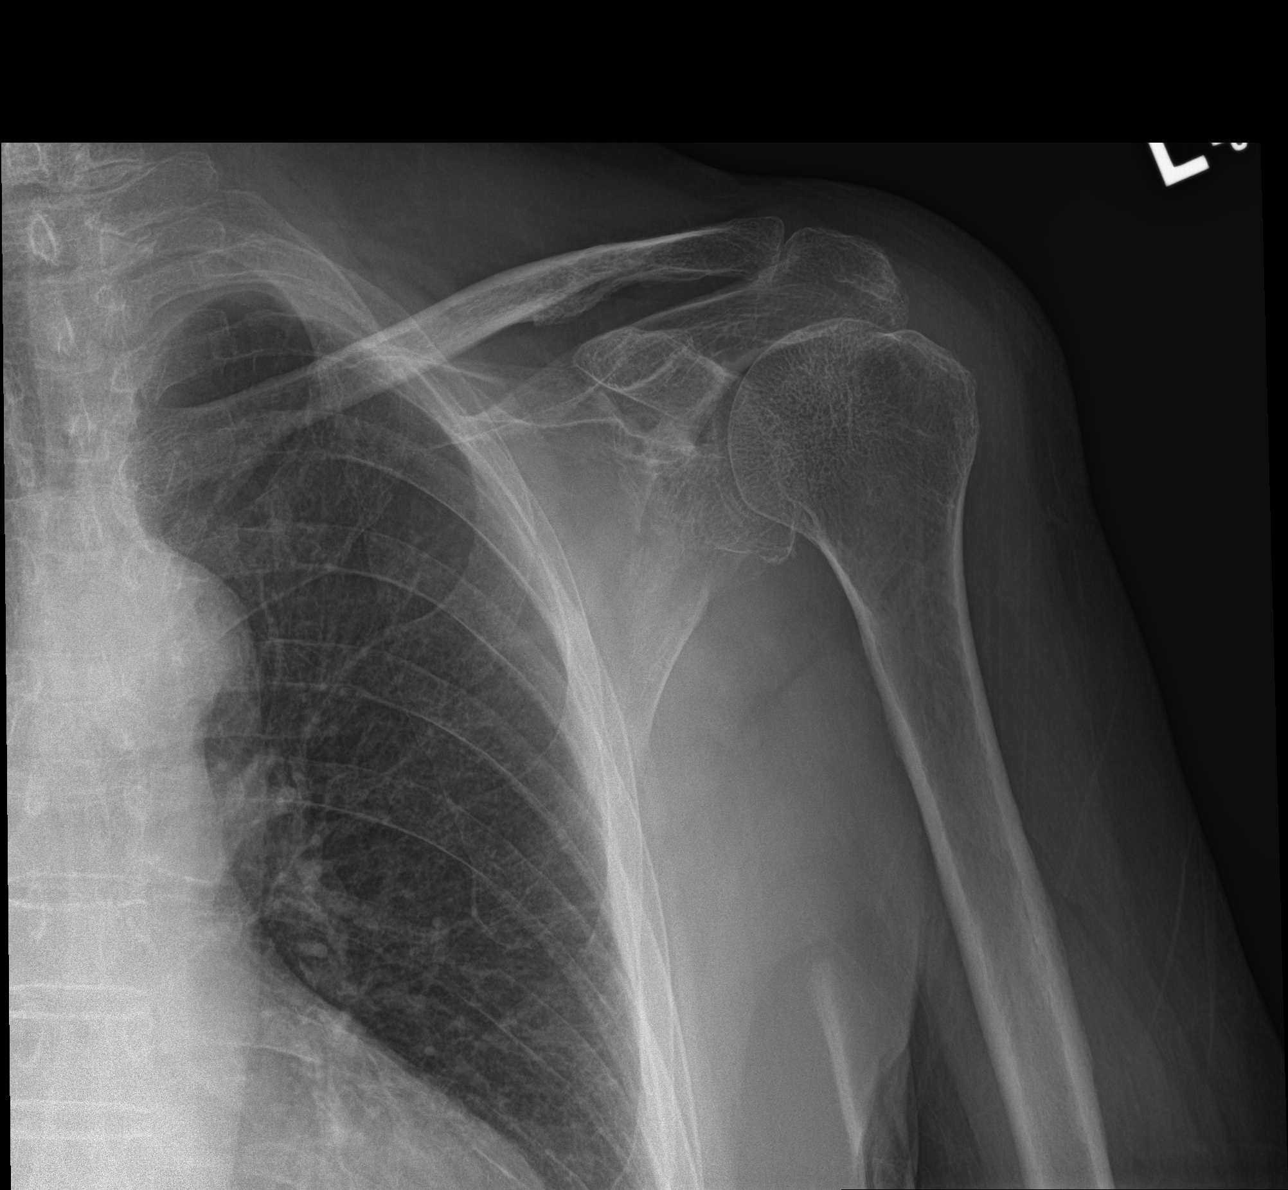

[3 of 3 positions shown; findings below may reference images not displayed]

FINDINGS: Osseous demineralization.

AC joint alignment normally aligned with mild degenerative changes.

Glenohumeral joint space narrowing, mild.

No acute fracture, dislocation, or bone destruction.

Visualized ribs intact.
IMPRESSION: Osseous demineralization with degenerative changes of the LEFT AC
joint and LEFT glenohumeral joint.

No acute abnormalities.

## 2020-11-28 ENCOUNTER — Ambulatory Visit (INDEPENDENT_AMBULATORY_CARE_PROVIDER_SITE_OTHER): Payer: Medicare HMO

## 2020-11-28 ENCOUNTER — Encounter: Payer: Self-pay | Admitting: Emergency Medicine

## 2020-11-28 ENCOUNTER — Emergency Department: Payer: Medicare HMO

## 2020-11-28 ENCOUNTER — Emergency Department
Admission: EM | Admit: 2020-11-28 | Discharge: 2020-11-28 | Disposition: A | Discharge status: Home / Self Care | Payer: Medicare HMO | Attending: Emergency Medicine | Admitting: Emergency Medicine

## 2020-11-28 ENCOUNTER — Ambulatory Visit
Admission: EM | Admit: 2020-11-28 | Discharge: 2020-11-28 | Disposition: A | Discharge status: Home / Self Care | Payer: Medicare HMO | Attending: Emergency Medicine | Admitting: Emergency Medicine

## 2020-11-28 ENCOUNTER — Other Ambulatory Visit: Payer: Self-pay

## 2020-11-28 DIAGNOSIS — R079 Chest pain, unspecified: Secondary | ICD-10-CM | POA: Diagnosis present

## 2020-11-28 DIAGNOSIS — Z5321 Procedure and treatment not carried out due to patient leaving prior to being seen by health care provider: Secondary | ICD-10-CM | POA: Diagnosis not present

## 2020-11-28 DIAGNOSIS — M25512 Pain in left shoulder: Secondary | ICD-10-CM

## 2020-11-28 DIAGNOSIS — M79602 Pain in left arm: Secondary | ICD-10-CM | POA: Diagnosis not present

## 2020-11-28 LAB — CBC WITH DIFFERENTIAL/PLATELET
Abs Immature Granulocytes: 0.06 10*3/uL (ref 0.00–0.07)
Basophils Absolute: 0.1 10*3/uL (ref 0.0–0.1)
Basophils Relative: 1 %
Eosinophils Absolute: 0.2 10*3/uL (ref 0.0–0.5)
Eosinophils Relative: 2 %
HCT: 47.7 % — ABNORMAL HIGH (ref 36.0–46.0)
Hemoglobin: 15.2 g/dL — ABNORMAL HIGH (ref 12.0–15.0)
Immature Granulocytes: 1 %
Lymphocytes Relative: 18 %
Lymphs Abs: 2.2 10*3/uL (ref 0.7–4.0)
MCH: 28.6 pg (ref 26.0–34.0)
MCHC: 31.9 g/dL (ref 30.0–36.0)
MCV: 89.7 fL (ref 80.0–100.0)
Monocytes Absolute: 0.8 10*3/uL (ref 0.1–1.0)
Monocytes Relative: 7 %
Neutro Abs: 8.6 10*3/uL — ABNORMAL HIGH (ref 1.7–7.7)
Neutrophils Relative %: 71 %
Platelets: 306 10*3/uL (ref 150–400)
RBC: 5.32 MIL/uL — ABNORMAL HIGH (ref 3.87–5.11)
RDW: 13.2 % (ref 11.5–15.5)
WBC: 11.9 10*3/uL — ABNORMAL HIGH (ref 4.0–10.5)
nRBC: 0 % (ref 0.0–0.2)

## 2020-11-28 LAB — COMPREHENSIVE METABOLIC PANEL
ALT: 13 U/L (ref 0–44)
AST: 20 U/L (ref 15–41)
Albumin: 3.8 g/dL (ref 3.5–5.0)
Alkaline Phosphatase: 70 U/L (ref 38–126)
Anion gap: 12 (ref 5–15)
BUN: 10 mg/dL (ref 8–23)
CO2: 25 mmol/L (ref 22–32)
Calcium: 9.5 mg/dL (ref 8.9–10.3)
Chloride: 104 mmol/L (ref 98–111)
Creatinine, Ser: 0.99 mg/dL (ref 0.44–1.00)
GFR, Estimated: 57 mL/min — ABNORMAL LOW (ref >60)
Glucose, Bld: 210 mg/dL — ABNORMAL HIGH (ref 70–99)
Potassium: 3.5 mmol/L (ref 3.5–5.1)
Sodium: 141 mmol/L (ref 135–145)
Total Bilirubin: 0.7 mg/dL (ref 0.3–1.2)
Total Protein: 7.9 g/dL (ref 6.5–8.1)

## 2020-11-28 LAB — TROPONIN I (HIGH SENSITIVITY)
Troponin I (High Sensitivity): 6 ng/L (ref <18)
Troponin I (High Sensitivity): 4 ng/L (ref <18)

## 2020-11-28 MED ORDER — HYDROCODONE-ACETAMINOPHEN 5-325 MG PO TABS: 2.0000 | ORAL_TABLET | ORAL | 0 refills | Status: AC | PRN

## 2020-11-28 NOTE — ED Triage Notes (Signed)
Patient c/o left arm pain that started yesterday. She states she is not able to lift her arm. She denies falling or hitting her arm on anything. She also reports the left side of her chest feeling heavy.

## 2020-11-28 NOTE — ED Triage Notes (Signed)
Pt to ED from home c/o central chest pain radiating to back and left that feels like something sitting on chest, SOB, nausea but no vomiting or diarrhea.  Pt A&Ox4, chest rise even and unlabored, skin WNL, in NAD at this time.

## 2020-11-28 NOTE — ED Provider Notes (Signed)
[facility]    CSN: [id number] Arrival date & time: 11/28/20  4536      History   Chief Complaint Chief Complaint  Patient presents with  . Arm Pain    Left arm   . Chest Pain    HPI Karen Page is a 85 y.o. female.   HPI   85 year old female here for evaluation of left-sided chest pressure, shortness of breath, nausea, and severe left arm pain.  Patient and family report that her symptoms started yesterday afternoon. She was evaluated in the ER at around midnight, had an EKG, chest x-ray, and labs but left without being seen due to the wait. Patient states that she is still having some chest pressure but her largest complaint is her left arm pain. She reports that the pain goes from her shoulder joint down to below her elbow. The pain increases with any kind of movement. Patient denies numbness, tingling, or weakness. Patient has not had any sweats.  Past Medical History:  Diagnosis Date  . Back pain   . Diabetes mellitus without complication (HCC)   . GERD (gastroesophageal reflux disease)   . Hypertension     There are no problems to display for this patient.   Past Surgical History:  Procedure Laterality Date  . ABDOMINAL HYSTERECTOMY    . renal stents      OB History   No obstetric history on file.      Home Medications    Prior to Admission medications   Medication Sig Start Date End Date Taking? Authorizing Provider  Cholecalciferol 50 MCG (2000 UT) CAPS Take 1 tablet by mouth daily. 04/01/20  Yes [provider]  empagliflozin (JARDIANCE) 25 MG TABS tablet Take by mouth. 01/24/20  Yes [provider]  FLUoxetine (PROZAC) 20 MG capsule Take 20 mg by mouth daily.   Yes [provider]  glipiZIDE (GLUCOTROL) 10 MG tablet Take 10 mg by mouth 2 (two) times daily. 07/08/17  Yes [provider]  HYDROcodone-acetaminophen (NORCO/VICODIN) 5-325 MG tablet Take 2 tablets by mouth every 4 (four) hours as  needed. 11/28/20  Yes Becky Augusta, NP  metoprolol tartrate (LOPRESSOR) 25 MG tablet Take 12.5 mg by mouth 2 (two) times daily. 09/15/18 11/28/20 Yes [provider]  ondansetron (ZOFRAN) 4 MG tablet Take by mouth.   Yes [provider]  pantoprazole (PROTONIX) 40 MG tablet Take 40 mg by mouth daily. 09/15/18  Yes [provider]  atorvastatin (LIPITOR) 40 MG tablet Take 40 mg by mouth daily. 07/05/18 07/05/19  [provider]  gabapentin (NEURONTIN) 300 MG capsule Take 300 mg by mouth 2 (two) times daily. 08/22/18 01/24/19  [provider]  methimazole (TAPAZOLE) 5 MG tablet Take 5 mg by mouth daily. 07/05/18 07/05/19  [provider]  metFORMIN (GLUCOPHAGE-XR) 500 MG 24 hr tablet Take 1,000 mg by mouth 2 (two) times daily. 06/19/18 11/28/20  [provider]    Family History History reviewed. No pertinent family history.  Social History Social History   Tobacco Use  . Smoking status: Former Smoker    Quit date: 2013    Years since quitting: 9.0  . Smokeless tobacco: Never Used  Substance Use Topics  . Alcohol use: No  . Drug use: No     Allergies   Aspirin, Codeine, Tramadol, Ketorolac, and Penicillins   Review of Systems Review of Systems  Constitutional: Negative for fever.  Respiratory: Positive for shortness of breath.   Cardiovascular: Positive for  chest pain. Negative for palpitations.  Gastrointestinal: Positive for nausea. Negative for diarrhea and vomiting.  Musculoskeletal: Positive for arthralgias.  Skin: Negative for rash.  Hematological: Negative.   Psychiatric/Behavioral: Negative.      Physical Exam Triage Vital Signs ED Triage Vitals  Enc Vitals Group     BP      Pulse      Resp      Temp      Temp src      SpO2      Weight      Height      Head Circumference      Peak Flow      Pain Score      Pain Loc      Pain Edu?      Excl. in [place]?    No data found.  Updated Vital Signs BP  139/90 (BP Location: Right Arm)   Pulse (!) 120   Temp 98 F (36.7 C) (Oral)   Resp 18   Ht 5\' 4"  (1.626 m)   Wt 160 lb 0.9 oz (72.6 kg)   SpO2 93%   BMI 27.47 kg/m   Visual Acuity Right Eye Distance:   Left Eye Distance:   Bilateral Distance:    Right Eye Near:   Left Eye Near:    Bilateral Near:     Physical Exam Vitals and nursing note reviewed.  Constitutional:      General: She is not in acute distress.    Appearance: She is well-developed. She is not toxic-appearing.  HENT:     Head: Normocephalic and atraumatic.  Eyes:     Extraocular Movements: Extraocular movements intact.     Pupils: Pupils are equal, round, and reactive to light.  Cardiovascular:     Rate and Rhythm: Normal rate and regular rhythm.     Heart sounds: Normal heart sounds. No murmur heard. No gallop.   Pulmonary:     Effort: Pulmonary effort is normal. No tachypnea.     Breath sounds: No decreased breath sounds, wheezing, rhonchi or rales.  Chest:     Chest wall: No tenderness.  Musculoskeletal:        General: Tenderness present.     Cervical back: Normal range of motion and neck supple.  Skin:    General: Skin is warm and dry.     Capillary Refill: Capillary refill takes less than 2 seconds.  Neurological:     General: No focal deficit present.     Mental Status: She is alert and oriented to person, place, and time.  Psychiatric:        Mood and Affect: Mood normal.        Behavior: Behavior normal.      UC Treatments / Results  Labs (all labs ordered are listed, but only abnormal results are displayed) Labs Reviewed  TROPONIN I (HIGH SENSITIVITY)    EKG EKG shows sinus tach with a rate of 115. Patient does have left axis deviation and nonspecific ST changes. PR was unable to be captured, QRS 78.   Radiology DG Chest 2 View  Result Date: 11/28/2020 CLINICAL DATA:  Chest pain EXAM: CHEST - 2 VIEW COMPARISON:  12/12/2018 FINDINGS: The lungs are symmetrically hyperinflated,  similar to prior examination, in keeping with changes of underlying COPD. Chronic interstitial coarsening is again seen at the lung bases, nonspecific. No confluent pulmonary infiltrate. No pneumothorax or pleural effusion. Cardiac size within normal limits. Pulmonary vascularity is normal. No acute  bone abnormality. IMPRESSION: No active cardiopulmonary disease.  COPD. Electronically Signed   By: Helyn Numbers MD   On: 11/28/2020 01:05   DG Humerus Left  Result Date: 11/28/2020 CLINICAL DATA:  Acute left arm pain without known injury. EXAM: LEFT HUMERUS - 2+ VIEW COMPARISON:  None. FINDINGS: There is no evidence of fracture or other focal bone lesions. Soft tissues are unremarkable. IMPRESSION: Negative. Electronically Signed   By: Lupita Raider M.D.   On: 11/28/2020 10:05    Procedures Procedures (including critical care time)  Medications Ordered in UC Medications - No data to display  Initial Impression / Assessment and Plan / UC Course  I have reviewed the triage vital signs and the nursing notes.  Pertinent labs & imaging results that were available during my care of the patient were reviewed by me and considered in my medical decision making (see chart for details).   Patient is here for evaluation of left arm pain and chest pressure. Patient was evaluated in the ER at [facility] yesterday but left without being seen. She did have blood work, chest x-ray, and EKG. EKG showed nonspecific ST changes but otherwise was unremarkable. Her troponin was 4. Chest x-ray was clear. Patient's primary complaint is her left arm pain. Patient has marked tenderness of the left glenohumeral joint. Any attempts at flexion, extension, internal or external rotation causes severe pain. Pain radiates down to the level of the elbow. Patient has no pain with flexion and extension of the elbow. With pronation of the left wrist patient reports that she feels pain in her distal humerus. Radial and ulnar pulses are 2+.  Cap refills less than 2 seconds. Patient has full sensation. Patient does have slightly decreased grip strength secondary to pain. Patient family both report that the arm pain started before any chest pressure or nausea. Patient's initial cardiac work-up was negative. Will obtain x-ray of left humerus. We will also repeat EKG.  EKG does not show any signs of acute MI.  Repeat troponin is 4.  Suspect patient's pain is musculoskeletal in nature.  She was evaluated in August for a similar complaint and discharged home on Vicodin.  Patient was at that time advised to follow-up with orthopedics.  Patient's heart score is 6.  Will discharge patient home with Vicodin again and advised her to follow-up with orthopedics as previously directed.   Final Clinical Impressions(s) / UC Diagnoses   Final diagnoses:  Acute pain of left shoulder     Discharge Instructions     Use the Norco as needed, 1 tablet every 6 hours, for pain in her shoulder.  I would recommend that you follow-up with orthopedics for further work-up of your shoulder pain as this may be related to arthritis.  You can also take over-the-counter ibuprofen according to package instructions in addition to the Norco.  Moist heat is also been shown to be very effective.      ED Prescriptions    Medication Sig Dispense Auth. Provider   HYDROcodone-acetaminophen (NORCO/VICODIN) 5-325 MG tablet Take 2 tablets by mouth every 4 (four) hours as needed. 10 tablet Becky Augusta, NP     I have reviewed the PDMP during this encounter.   Becky Augusta, NP 11/28/20 1110

## 2020-11-28 NOTE — Discharge Instructions (Addendum)
Use the Norco as needed, 1 tablet every 6 hours, for pain in her shoulder.  I would recommend that you follow-up with orthopedics for further work-up of your shoulder pain as this may be related to arthritis.  You can also take over-the-counter ibuprofen according to package instructions in addition to the Norco.  Moist heat is also been shown to be very effective.

## 2021-12-05 ENCOUNTER — Other Ambulatory Visit: Payer: Self-pay

## 2021-12-05 ENCOUNTER — Ambulatory Visit
Admission: EM | Admit: 2021-12-05 | Discharge: 2021-12-05 | Disposition: A | Discharge status: Home / Self Care | Payer: Medicare HMO

## 2021-12-05 DIAGNOSIS — W19XXXA Unspecified fall, initial encounter: Secondary | ICD-10-CM | POA: Diagnosis not present

## 2021-12-05 DIAGNOSIS — Z8679 Personal history of other diseases of the circulatory system: Secondary | ICD-10-CM

## 2021-12-05 DIAGNOSIS — R55 Syncope and collapse: Secondary | ICD-10-CM | POA: Diagnosis not present

## 2021-12-05 NOTE — ED Triage Notes (Signed)
Pt c/o fall on 12/04/21. Pt is having dizzy spells and has bruises along both breasts. Pt hit her chest against the sink. Pt states that she has been experiencing nausea and dizziness and "blacked out" yesterday. Pt is feeling dizzy today and states that I am spinning.

## 2021-12-05 NOTE — ED Notes (Signed)
Patient is being discharged from the Urgent Care and sent to the Emergency Department via POV . Per Nicoletta Ba, NP, patient is in need of higher level of care due to Fall, syncope. Patient is aware and verbalizes understanding of plan of care.  Vitals:   12/05/21 0818  BP: (!) 147/83  Pulse: 73  Resp: 18  Temp: 97.9 F (36.6 C)  SpO2: 95%

## 2021-12-05 NOTE — Discharge Instructions (Signed)
Go immediately to Er for further evaluation of syncopal episode, dizziness, fall. Do not eat or drink anything before cleared by provider.

## 2021-12-05 NOTE — ED Provider Notes (Signed)
[facility]    CSN: [id number] Arrival date & time: 12/05/21  0804      History   Chief Complaint Chief Complaint  Patient presents with   Fall    HPI Eloise Harmanlizabeth Mcgreal is a 87 y.o. female.   87 year old female pt, Eloise Harmanlizabeth Naff, presents to Urgent care with complaint of "blacking out yesterday", unknown cause, fall striking sink, has bilateral breast bruising, pt states she is still having dizziness. Pt denies chest pain,shortness of breath or palpitations.   The history is provided by the patient. No language interpreter was used.   Past Medical History:  Diagnosis Date   Back pain    Diabetes mellitus without complication (HCC)    GERD (gastroesophageal reflux disease)    Hypertension     Patient Active Problem List   Diagnosis Date Noted   Fall 12/05/2021   Syncope 12/05/2021   History of hypertension 12/05/2021    Past Surgical History:  Procedure Laterality Date   ABDOMINAL HYSTERECTOMY     renal stents      OB History   No obstetric history on file.      Home Medications    Prior to Admission medications   Medication Sig Start Date End Date Taking? Authorizing Provider  atorvastatin (LIPITOR) 40 MG tablet Take 40 mg by mouth daily. 07/05/18 12/05/21 Yes [provider]  Cholecalciferol 50 MCG (2000 UT) CAPS Take 1 tablet by mouth daily. 04/01/20  Yes [provider]  empagliflozin (JARDIANCE) 25 MG TABS tablet Take by mouth. 01/24/20  Yes [provider]  FLUoxetine (PROZAC) 20 MG capsule Take 20 mg by mouth daily.   Yes [provider]  glipiZIDE (GLUCOTROL) 10 MG tablet Take 10 mg by mouth 2 (two) times daily. 07/08/17  Yes [provider]  HYDROcodone-acetaminophen (NORCO/VICODIN) 5-325 MG tablet Take 2 tablets by mouth every 4 (four) hours as needed. 11/28/20  Yes Becky Augustayan, Jeremy, NP  methimazole (TAPAZOLE) 5 MG tablet Take 5 mg by mouth daily. 07/05/18 12/05/21 Yes [provider]   metoprolol tartrate (LOPRESSOR) 25 MG tablet Take 12.5 mg by mouth 2 (two) times daily. 09/15/18 12/05/21 Yes [provider]  ondansetron (ZOFRAN) 4 MG tablet Take by mouth.   Yes [provider]  pantoprazole (PROTONIX) 40 MG tablet Take 40 mg by mouth daily. 09/15/18  Yes [provider]  gabapentin (NEURONTIN) 300 MG capsule Take 300 mg by mouth 2 (two) times daily. 08/22/18 01/24/19  [provider]  metFORMIN (GLUCOPHAGE-XR) 500 MG 24 hr tablet Take 1,000 mg by mouth 2 (two) times daily. 06/19/18 11/28/20  [provider]    Family History History reviewed. No pertinent family history.  Social History Social History   Tobacco Use   Smoking status: Former    Types: Cigarettes    Quit date: 2013    Years since quitting: 10.0   Smokeless tobacco: Never  Substance Use Topics   Alcohol use: No   Drug use: No     Allergies   Aspirin, Codeine, Tramadol, Ketorolac, and Penicillins   Review of Systems Review of Systems  Respiratory:  Negative for shortness of breath.   Cardiovascular:  Negative for chest pain and palpitations.  Skin:  Positive for color change.  Neurological:  Positive for dizziness and syncope.  All other systems reviewed and are negative.   Physical Exam Triage Vital Signs ED Triage Vitals  Enc Vitals Group     BP 12/05/21 0818 (!) 147/83  Pulse Rate 12/05/21 0818 73     Resp 12/05/21 0818 18     Temp 12/05/21 0818 97.9 F (36.6 C)     Temp Source 12/05/21 0818 Oral     SpO2 12/05/21 0818 95 %     Weight 12/05/21 0816 160 lb (72.6 kg)     Height 12/05/21 0816 5\' 2"  (1.575 m)     Head Circumference --      Peak Flow --      Pain Score --      Pain Loc --      Pain Edu? --      Excl. in [place]? --    No data found.  Updated Vital Signs BP (!) 147/83 (BP Location: Left Arm)    Pulse 73    Temp 97.9 F (36.6 C) (Oral)    Resp 18    Ht 5\' 2"  (1.575 m)    Wt 160 lb (72.6 kg)    SpO2 95%    BMI 29.26 kg/m    Visual Acuity Right Eye Distance:   Left Eye Distance:   Bilateral Distance:    Right Eye Near:   Left Eye Near:    Bilateral Near:     Physical Exam Vitals and nursing note reviewed.  Constitutional:      General: She is not in acute distress.    Appearance: She is well-developed and well-groomed.  HENT:     Head: Normocephalic and atraumatic.  Eyes:     Conjunctiva/sclera: Conjunctivae normal.  Cardiovascular:     Rate and Rhythm: Normal rate and regular rhythm.     Heart sounds: No murmur heard. Pulmonary:     Effort: Pulmonary effort is normal. No respiratory distress.     Breath sounds: Normal breath sounds and air entry.  Chest:       Comments: Contusion/ecchymosis to bilateral breasts/chest wall, no crepitus Abdominal:     Palpations: Abdomen is soft.     Tenderness: There is no abdominal tenderness.  Musculoskeletal:        General: No swelling.     Cervical back: Neck supple.  Skin:    General: Skin is warm and dry.     Capillary Refill: Capillary refill takes less than 2 seconds.  Neurological:     General: No focal deficit present.     Mental Status: She is alert and oriented to person, place, and time.     GCS: GCS eye subscore is 4. GCS verbal subscore is 5. GCS motor subscore is 6.  Psychiatric:        Attention and Perception: Attention normal.        Mood and Affect: Mood normal.        Speech: Speech normal.        Behavior: Behavior normal. Behavior is cooperative.     UC Treatments / Results  Labs (all labs ordered are listed, but only abnormal results are displayed) Labs Reviewed - No data to display  EKG   Radiology No results found.  Procedures Procedures (including critical care time)  Medications Ordered in UC Medications - No data to display  Initial Impression / Assessment and Plan / UC Course  I have reviewed the triage vital signs and the nursing notes.  Pertinent labs & imaging results that were available during my  care of the patient were reviewed by me and considered in my medical decision making (see chart for details).     Ddx: fall, syncope unknown cause,  chest contusion, rib fractures Final Clinical Impressions(s) / UC Diagnoses   Final diagnoses:  Fall, initial encounter  Syncope, unspecified syncope type  History of hypertension     Discharge Instructions      Go immediately to Er for further evaluation of syncopal episode, dizziness, fall. Do not eat or drink anything before cleared by provider.     ED Prescriptions   None    PDMP not reviewed this encounter.   Tori Milks, NP [id number] [phone]
